# Patient Record
Sex: Female | Born: 1955 | Race: Black or African American | Hispanic: No | Marital: Single | Smoking: Never smoker
Health system: Southern US, Community
[De-identification: ages and names within clinical notes are randomized; demographics above are authoritative.]

## PROBLEM LIST (undated history)

## (undated) DIAGNOSIS — K922 Gastrointestinal hemorrhage, unspecified: Secondary | ICD-10-CM

## (undated) DIAGNOSIS — N189 Chronic kidney disease, unspecified: Secondary | ICD-10-CM

## (undated) DIAGNOSIS — B20 Human immunodeficiency virus [HIV] disease: Secondary | ICD-10-CM

## (undated) DIAGNOSIS — I10 Essential (primary) hypertension: Secondary | ICD-10-CM

---

## 2020-03-06 ENCOUNTER — Encounter (HOSPITAL_COMMUNITY): Payer: Self-pay | Admitting: Internal Medicine

## 2020-03-06 ENCOUNTER — Emergency Department (HOSPITAL_COMMUNITY): Payer: Medicare Other

## 2020-03-06 ENCOUNTER — Inpatient Hospital Stay (HOSPITAL_COMMUNITY)
Admission: EM | Admit: 2020-03-06 | Discharge: 2020-03-08 | DRG: 291 | Disposition: A | Discharge status: Home / Self Care | Payer: Medicare Other | Attending: Internal Medicine | Admitting: Internal Medicine

## 2020-03-06 ENCOUNTER — Other Ambulatory Visit: Payer: Self-pay

## 2020-03-06 DIAGNOSIS — B20 Human immunodeficiency virus [HIV] disease: Secondary | ICD-10-CM | POA: Diagnosis present

## 2020-03-06 DIAGNOSIS — Z882 Allergy status to sulfonamides status: Secondary | ICD-10-CM | POA: Diagnosis not present

## 2020-03-06 DIAGNOSIS — R000 Tachycardia, unspecified: Secondary | ICD-10-CM | POA: Diagnosis not present

## 2020-03-06 DIAGNOSIS — I272 Pulmonary hypertension, unspecified: Secondary | ICD-10-CM | POA: Diagnosis present

## 2020-03-06 DIAGNOSIS — Z20822 Contact with and (suspected) exposure to covid-19: Secondary | ICD-10-CM | POA: Diagnosis present

## 2020-03-06 DIAGNOSIS — I248 Other forms of acute ischemic heart disease: Secondary | ICD-10-CM | POA: Diagnosis present

## 2020-03-06 DIAGNOSIS — F419 Anxiety disorder, unspecified: Secondary | ICD-10-CM

## 2020-03-06 DIAGNOSIS — J9601 Acute respiratory failure with hypoxia: Secondary | ICD-10-CM | POA: Diagnosis present

## 2020-03-06 DIAGNOSIS — E8770 Fluid overload, unspecified: Secondary | ICD-10-CM

## 2020-03-06 DIAGNOSIS — J810 Acute pulmonary edema: Secondary | ICD-10-CM | POA: Diagnosis not present

## 2020-03-06 DIAGNOSIS — G44209 Tension-type headache, unspecified, not intractable: Secondary | ICD-10-CM | POA: Diagnosis present

## 2020-03-06 DIAGNOSIS — I132 Hypertensive heart and chronic kidney disease with heart failure and with stage 5 chronic kidney disease, or end stage renal disease: Principal | ICD-10-CM | POA: Diagnosis present

## 2020-03-06 DIAGNOSIS — I5042 Chronic combined systolic (congestive) and diastolic (congestive) heart failure: Secondary | ICD-10-CM | POA: Diagnosis present

## 2020-03-06 DIAGNOSIS — G8929 Other chronic pain: Secondary | ICD-10-CM | POA: Diagnosis present

## 2020-03-06 DIAGNOSIS — D631 Anemia in chronic kidney disease: Secondary | ICD-10-CM | POA: Diagnosis present

## 2020-03-06 DIAGNOSIS — Z880 Allergy status to penicillin: Secondary | ICD-10-CM | POA: Diagnosis not present

## 2020-03-06 DIAGNOSIS — E1122 Type 2 diabetes mellitus with diabetic chronic kidney disease: Secondary | ICD-10-CM

## 2020-03-06 DIAGNOSIS — I160 Hypertensive urgency: Secondary | ICD-10-CM | POA: Diagnosis not present

## 2020-03-06 DIAGNOSIS — N186 End stage renal disease: Secondary | ICD-10-CM | POA: Diagnosis present

## 2020-03-06 DIAGNOSIS — I161 Hypertensive emergency: Secondary | ICD-10-CM | POA: Diagnosis present

## 2020-03-06 DIAGNOSIS — Z992 Dependence on renal dialysis: Secondary | ICD-10-CM

## 2020-03-06 DIAGNOSIS — E875 Hyperkalemia: Secondary | ICD-10-CM | POA: Diagnosis present

## 2020-03-06 DIAGNOSIS — D72829 Elevated white blood cell count, unspecified: Secondary | ICD-10-CM

## 2020-03-06 DIAGNOSIS — Z9119 Patient's noncompliance with other medical treatment and regimen: Secondary | ICD-10-CM | POA: Diagnosis not present

## 2020-03-06 DIAGNOSIS — Z681 Body mass index (BMI) 19 or less, adult: Secondary | ICD-10-CM

## 2020-03-06 DIAGNOSIS — Z888 Allergy status to other drugs, medicaments and biological substances status: Secondary | ICD-10-CM | POA: Diagnosis not present

## 2020-03-06 DIAGNOSIS — R636 Underweight: Secondary | ICD-10-CM | POA: Diagnosis present

## 2020-03-06 DIAGNOSIS — Z79899 Other long term (current) drug therapy: Secondary | ICD-10-CM | POA: Diagnosis not present

## 2020-03-06 DIAGNOSIS — I120 Hypertensive chronic kidney disease with stage 5 chronic kidney disease or end stage renal disease: Secondary | ICD-10-CM

## 2020-03-06 DIAGNOSIS — D638 Anemia in other chronic diseases classified elsewhere: Secondary | ICD-10-CM

## 2020-03-06 DIAGNOSIS — I10 Essential (primary) hypertension: Secondary | ICD-10-CM

## 2020-03-06 HISTORY — DX: Chronic kidney disease, unspecified: N18.9

## 2020-03-06 HISTORY — DX: Essential (primary) hypertension: I10

## 2020-03-06 HISTORY — DX: Human immunodeficiency virus (HIV) disease: B20

## 2020-03-06 HISTORY — DX: Gastrointestinal hemorrhage, unspecified: K92.2

## 2020-03-06 LAB — I-STAT CHEM 8, ED
BUN: 91 mg/dL — ABNORMAL HIGH (ref 8–23)
Calcium, Ion: 1 mmol/L — ABNORMAL LOW (ref 1.15–1.40)
Chloride: 108 mmol/L (ref 98–111)
Creatinine, Ser: 10.9 mg/dL — ABNORMAL HIGH (ref 0.44–1.00)
Glucose, Bld: 103 mg/dL — ABNORMAL HIGH (ref 70–99)
HCT: 44 % (ref 36.0–46.0)
Hemoglobin: 15 g/dL (ref 12.0–15.0)
Potassium: 6.3 mmol/L (ref 3.5–5.1)
Sodium: 139 mmol/L (ref 135–145)
TCO2: 22 mmol/L (ref 22–32)

## 2020-03-06 LAB — COMPREHENSIVE METABOLIC PANEL
ALT: 24 U/L (ref 0–44)
AST: 61 U/L — ABNORMAL HIGH (ref 15–41)
Albumin: 2.8 g/dL — ABNORMAL LOW (ref 3.5–5.0)
Alkaline Phosphatase: 92 U/L (ref 38–126)
Anion gap: 21 — ABNORMAL HIGH (ref 5–15)
BUN: 64 mg/dL — ABNORMAL HIGH (ref 8–23)
CO2: 17 mmol/L — ABNORMAL LOW (ref 22–32)
Calcium: 9.4 mg/dL (ref 8.9–10.3)
Chloride: 102 mmol/L (ref 98–111)
Creatinine, Ser: 10.14 mg/dL — ABNORMAL HIGH (ref 0.44–1.00)
GFR calc non Af Amer: 4 mL/min — ABNORMAL LOW (ref >60)
Glucose, Bld: 103 mg/dL — ABNORMAL HIGH (ref 70–99)
Potassium: 6.6 mmol/L (ref 3.5–5.1)
Sodium: 140 mmol/L (ref 135–145)
Total Bilirubin: 1.6 mg/dL — ABNORMAL HIGH (ref 0.3–1.2)
Total Protein: 6.9 g/dL (ref 6.5–8.1)

## 2020-03-06 LAB — BASIC METABOLIC PANEL
Anion gap: 18 — ABNORMAL HIGH (ref 5–15)
BUN: 22 mg/dL (ref 8–23)
CO2: 24 mmol/L (ref 22–32)
Calcium: 9.4 mg/dL (ref 8.9–10.3)
Chloride: 97 mmol/L — ABNORMAL LOW (ref 98–111)
Creatinine, Ser: 4.47 mg/dL — ABNORMAL HIGH (ref 0.44–1.00)
GFR, Estimated: 10 mL/min — ABNORMAL LOW (ref >60)
Glucose, Bld: 85 mg/dL (ref 70–99)
Potassium: 4 mmol/L (ref 3.5–5.1)
Sodium: 139 mmol/L (ref 135–145)

## 2020-03-06 LAB — RESPIRATORY PANEL BY RT PCR (FLU A&B, COVID)
Influenza A by PCR: NEGATIVE
Influenza B by PCR: NEGATIVE
SARS Coronavirus 2 by RT PCR: NEGATIVE

## 2020-03-06 LAB — CBC
HCT: 43 % (ref 36.0–46.0)
Hemoglobin: 13.8 g/dL (ref 12.0–15.0)
MCH: 32.7 pg (ref 26.0–34.0)
MCHC: 32.1 g/dL (ref 30.0–36.0)
MCV: 101.9 fL — ABNORMAL HIGH (ref 80.0–100.0)
Platelets: 288 10*3/uL (ref 150–400)
RBC: 4.22 MIL/uL (ref 3.87–5.11)
RDW: 19.2 % — ABNORMAL HIGH (ref 11.5–15.5)
WBC: 12.3 10*3/uL — ABNORMAL HIGH (ref 4.0–10.5)
nRBC: 0 % (ref 0.0–0.2)

## 2020-03-06 LAB — BRAIN NATRIURETIC PEPTIDE: B Natriuretic Peptide: >4500 pg/mL — ABNORMAL HIGH (ref 0.0–100.0)

## 2020-03-06 LAB — HIV ANTIBODY (ROUTINE TESTING W REFLEX): HIV Screen 4th Generation wRfx: REACTIVE — AB

## 2020-03-06 LAB — TROPONIN I (HIGH SENSITIVITY)
Troponin I (High Sensitivity): 25 ng/L — ABNORMAL HIGH (ref <18)
Troponin I (High Sensitivity): 57 ng/L — ABNORMAL HIGH (ref <18)

## 2020-03-06 LAB — PHOSPHORUS: Phosphorus: 4.4 mg/dL (ref 2.5–4.6)

## 2020-03-06 MED ORDER — ONDANSETRON HCL 4 MG PO TABS: 4.0000 mg | ORAL_TABLET | Freq: Four times a day (QID) | ORAL | Status: DC | PRN

## 2020-03-06 MED ORDER — ACETAMINOPHEN 325 MG PO TABS
650.0000 mg | ORAL_TABLET | Freq: Four times a day (QID) | ORAL | Status: DC | PRN
  Administered 2020-03-08: 650 mg via ORAL
  Filled 2020-03-06: qty 2

## 2020-03-06 MED ORDER — CHLORHEXIDINE GLUCONATE CLOTH 2 % EX PADS
6.0000 | MEDICATED_PAD | Freq: Every day | CUTANEOUS | Status: DC
  Administered 2020-03-07: 6 via TOPICAL

## 2020-03-06 MED ORDER — OXYCODONE HCL 5 MG PO TABS
5.0000 mg | ORAL_TABLET | Freq: Four times a day (QID) | ORAL | Status: AC | PRN
  Administered 2020-03-06 – 2020-03-07 (×2): 5 mg via ORAL
  Filled 2020-03-06 (×2): qty 1

## 2020-03-06 MED ORDER — HYDROMORPHONE HCL 1 MG/ML IJ SOLN
0.5000 mg | Freq: Once | INTRAMUSCULAR | Status: AC
  Administered 2020-03-06: 0.5 mg via INTRAVENOUS
  Filled 2020-03-06: qty 1

## 2020-03-06 MED ORDER — HYDROCODONE-ACETAMINOPHEN 5-325 MG PO TABS: 1.0000 | ORAL_TABLET | Freq: Once | ORAL | Status: AC

## 2020-03-06 MED ORDER — CALCIUM CHLORIDE 10 % IV SOLN
1.0000 g | Freq: Once | INTRAVENOUS | Status: DC
  Administered 2020-03-06: 1 g via INTRAVENOUS
  Filled 2020-03-06: qty 10

## 2020-03-06 MED ORDER — HYDRALAZINE HCL 20 MG/ML IJ SOLN
10.0000 mg | Freq: Once | INTRAMUSCULAR | Status: AC
  Administered 2020-03-06: 10 mg via INTRAVENOUS
  Filled 2020-03-06: qty 1

## 2020-03-06 MED ORDER — ONDANSETRON HCL 4 MG/2ML IJ SOLN: 4.0000 mg | Freq: Four times a day (QID) | INTRAMUSCULAR | Status: DC | PRN

## 2020-03-06 MED ORDER — HEPARIN SODIUM (PORCINE) 1000 UNIT/ML IJ SOLN
INTRAMUSCULAR | Status: AC
  Filled 2020-03-06: qty 4

## 2020-03-06 MED ORDER — RAMELTEON 8 MG PO TABS
8.0000 mg | ORAL_TABLET | Freq: Every day | ORAL | Status: DC
  Administered 2020-03-06 – 2020-03-07 (×2): 8 mg via ORAL
  Filled 2020-03-06 (×3): qty 1

## 2020-03-06 MED ORDER — SENNOSIDES-DOCUSATE SODIUM 8.6-50 MG PO TABS
1.0000 | ORAL_TABLET | Freq: Every evening | ORAL | Status: DC | PRN
  Administered 2020-03-07: 1 via ORAL
  Filled 2020-03-06: qty 1

## 2020-03-06 MED ORDER — ACETAMINOPHEN 650 MG RE SUPP: 650.0000 mg | Freq: Four times a day (QID) | RECTAL | Status: DC | PRN

## 2020-03-06 MED ORDER — CALCIUM CHLORIDE 10 % IV SOLN
1.0000 g | Freq: Once | INTRAVENOUS | Status: AC
  Administered 2020-03-06: 1 g via INTRAVENOUS

## 2020-03-06 MED ORDER — MEGESTROL ACETATE 40 MG PO TABS
40.0000 mg | ORAL_TABLET | Freq: Two times a day (BID) | ORAL | Status: DC
  Administered 2020-03-06 – 2020-03-08 (×4): 40 mg via ORAL
  Filled 2020-03-06 (×5): qty 1

## 2020-03-06 MED ORDER — HEPARIN SODIUM (PORCINE) 5000 UNIT/ML IJ SOLN
5000.0000 [IU] | Freq: Three times a day (TID) | INTRAMUSCULAR | Status: DC
  Filled 2020-03-06 (×2): qty 1

## 2020-03-06 MED ORDER — DEXTROSE 50 % IV SOLN
25.0000 g | Freq: Once | INTRAVENOUS | Status: AC
  Filled 2020-03-06: qty 50

## 2020-03-06 MED ORDER — INSULIN ASPART 100 UNIT/ML ~~LOC~~ SOLN
6.0000 [IU] | Freq: Once | SUBCUTANEOUS | Status: AC
  Administered 2020-03-06: 6 [IU] via INTRAVENOUS

## 2020-03-06 MED ORDER — DEXTROSE 50 % IV SOLN
INTRAVENOUS | Status: AC
  Administered 2020-03-06: 25 g via INTRAVENOUS
  Filled 2020-03-06: qty 50

## 2020-03-06 MED ORDER — SODIUM BICARBONATE 8.4 % IV SOLN
50.0000 meq | Freq: Once | INTRAVENOUS | Status: AC
  Administered 2020-03-06: 50 meq via INTRAVENOUS
  Filled 2020-03-06: qty 50

## 2020-03-06 MED ORDER — HYDROCODONE-ACETAMINOPHEN 5-325 MG PO TABS
ORAL_TABLET | ORAL | Status: AC
  Administered 2020-03-06: 1 via ORAL
  Filled 2020-03-06: qty 1

## 2020-03-06 NOTE — Progress Notes (Signed)
   03/06/20 1823  Assess: MEWS Score  Temp 99.8 F (37.7 C)  BP (!) 184/101  Pulse Rate (!) 114  ECG Heart Rate (!) 113  Resp (!) 30  Level of Consciousness Alert  SpO2 100 %  O2 Device Bi-PAP  Patient Activity (if Appropriate) In bed  FiO2 (%) 40 %  Assess: MEWS Score  MEWS Temp 0  MEWS Systolic 0  MEWS Pulse 2  MEWS RR 2  MEWS LOC 0  MEWS Score 4  MEWS Score Color Red  Assess: if the MEWS score is Yellow or Red  Were vital signs taken at a resting state? Yes  Focused Assessment No change from prior assessment  Early Detection of Sepsis Score *See Row Information* Medium  MEWS guidelines implemented *See Row Information* No, previously yellow, continue vital signs every 4 hours  Treat  MEWS Interventions Other (Comment) (paged MD residents)  Pain Scale 0-10  Pain Score 0  Take Vital Signs  Increase Vital Sign Frequency  Red: Q 1hr X 4 then Q 4hr X 4, if remains red, continue Q 4hrs  Escalate  MEWS: Escalate Red: discuss with charge nurse/RN and provider, consider discussing with RRT  Notify: Charge Nurse/RN  Name of Charge Nurse/RN Notified Barbie RN  Date Charge Nurse/RN Notified 03/06/20  Time Charge Nurse/RN Notified 7341  Notify: Provider  Provider Name/Title Resident MD  Date Provider Notified 03/06/20  Time Provider Notified 1820  Notification Type Page  Notification Reason Change in status  Response No new orders  Date of Provider Response 03/06/20  Time of Provider Response 1830  Document  Patient Outcome Other (Comment) (Monitor patient. No new interventions)  Progress note created (see row info) Yes   Patient arrived onto the unit from hemodialysis with a red MEWS score of 4/5 from a yellow MEWS. Patient is currently on the BIPAP and is tachypniec and tachycardic due to missed dialysis. MD paged and made aware. Spoke to MD on the phone and relayed that a SBP of 160s-180s is the current goal; wanting to drop the BP slowly. Agricultural consultant and RR made aware.  Red MEWS protocol implemented. VS parameters have been cycled for set times. Will continue to monitor for changes and report off to the oncoming RN.

## 2020-03-06 NOTE — Consult Note (Signed)
Renal Service Consult Note [facility]  Shirley Dodson 03/06/2020 [place] Requesting Physician:  Dr. Dareen Piano  Reason for Consult:  ESRD pt w/ resp distress HPI: The patient is a 64 y.o. year-old w/ unknown PMH , esrd on HD in [place], missed HD wed, last HD MWF. Presents to ED w/ resp distress. CXR showing pulm edema.  Asked to see for dialysis.   Pt unable to give much hx, on bipap in ED.  States her last HD was Monday, missed Wed, we couldn't communicate well enough to figure out why. Denies any active CP, abd pain or n/v/d.  No fevers, no prod cough.   ROS  denies CP  no joint pain   no HA  no blurry vision  no rash  no diarrhea  no nausea/ vomiting   Past Medical History No past medical history on file. Past Surgical History  Family History No family history on file. Social History  has no history on file for tobacco use, alcohol use, and drug use. Allergies  Allergies  Allergen Reactions   Penicillins Nausea Only, Rash and Other (See Comments)    Reaction occurred > 10 years ago. PCN allergy assessment completed 03/07/19 by Dorathy Daft, PharmD   Sulfa Antibiotics Nausea And Vomiting, Nausea Only and Rash    Severe gastro intolerance, gi distress   Nitrofuran Derivatives Other (See Comments)   Home medications Prior to Admission medications   Not on File     Vitals:   03/06/20 1130 03/06/20 1215 03/06/20 1245 03/06/20 1300  BP: (!) 202/100 (!) 200/110 (!) 199/100 (!) 185/95  Pulse: (!) 138 (!) 138 (!) 145 (!) 137  Resp: (!) 34 (!) 29 (!) 38 (!) 39  Temp:      SpO2: 100% 100% 100% 100%  Weight:    49.9 kg  Height:    5\' 4"  (1.626 m)   Exam Gen slim adult AAF on bipap, RR 30's, not in severe distress On Bipap No rash, cyanosis or gangrene Sclera anicteric, throat not seen  +JVD Chest bilat rales 1/3 up RRR no MRG Abd soft ntnd no mass or ascites +bs GU defer MS no joint effusions or deformity Ext 2+ pitting pretib/ pedal  edema Neuro is alert, Ox 3 , nf  LUA aVG+bruit   Home meds:  - no meds noted here         Na 140 K 6.6  CO2 17  BUN 64  CR 109.14  Alb 2.8  WBC 12k  hB 13  PLT 288     CXR - IMPRESSION: Cardiomegaly and symmetric airspace disease/alveolar edema.  Assessment/ Plan: 1. Pulm edema / resp distress - due to vol overload, missed HD. HD upstairs now, UF 3-4 L as tolerated. Bipap support.  2. Hyperkalemia - low K bath , f/u in am 3. ESRD - on HD MWF.  [place], get details after HD.   4. HIV 5. HTN - get vol down w hd, get home med list    Kelly Splinter  MD 03/06/2020, 1:48 PM  Recent Labs  Lab 03/06/20 0950 03/06/20 1023  WBC 12.3*  --   HGB 13.8 15.0   Recent Labs  Lab 03/06/20 0950 03/06/20 1023  K 6.6* 6.3*  BUN 64* 91*  CREATININE 10.14* 10.90*  CALCIUM 9.4  --
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
## 2020-03-06 NOTE — Progress Notes (Signed)
Changed patient to V-60 from servo and RR increased from 30 to 52. RN notified of change. RN requested pt transport. Assisted with transport to dialysis. RR continued to be 29 MD in Dialysis and RN notified. And RT covering unit notified of issues.
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
## 2020-03-06 NOTE — Progress Notes (Addendum)
Patient evaluated for continued tachypnea. She went to urgent HD today and had 2557ml removed, goal was 3-4L. Remains on BiPAP.  Subjective:  Patient states that she feels significantly better than on admission, but still having shortness of breath. Continues to have leg swelling. Asking to eat dinner. Also requesting her home narcotic medications.  Objective: VS: RR 32-37, SpO2 100%, HR 107 and sinus rhythm, BP 177/104 PE: tachypneic but appears fairly comfortable without accessory muscle use, moderate crackles in lung bases bilaterally, JVD elevated to the angle of the jaw, bilateral lower extremities with 3+ pitting edema to the knees, abdomen nondistended, bandage and stables noted from hernia repair 2 weeks ago Labs: K improved 6.3 > 4.0 and creatinine 10.9 > 4.47 after dialysis  Assessment:  Patient reports feeling improvement in SOB but remains tachypneic and volume overload despite dialysis this afternoon. Lack of accessory muscle use and only moderate crackles seem somewhat inconsistent with her respiratory rate, there could be an anxiety/pain component. BP is roughly at goal.  PDMP reviewed and last filled Oxycodone 5mg , 30 tablets on 9/24, unclear duration (written for 2 days but likely an error).  Plan:  -trial off BiPAP  -NPO -diuretics unhelpful with her ESRD -dialysis per nephro -low threshold to call PCCM for intubation -SBP goal 160s-180s, received hydralazine per nephro, continue to hold home BP meds for now     -restart clonidine if BP rises -oxycodone 5mg  q6hr prn  Addendum: -breathing comfortably off BiPAP. Noted to have normal RR while sleeping and then increased after waking her so suspect some anxiety component     -ordered ramelteon nightly -renal diet and fluid restriction -ordered home megace 40mg  BID

## 2020-03-06 NOTE — ED Provider Notes (Signed)
[facility] EMERGENCY DEPARTMENT Provider Note   CSN: [id number] Arrival date & time: 03/06/20  0093     History Chief Complaint  Patient presents with  . Shortness of Breath  . Respiratory Distress    Shirley Dodson is a 64 y.o. female.  Patient with hx esrd/hd M/W/F was driving to dialysis when became very sob. Per report, pt did have normal dialysis 2 days ago. Symptoms acute onset, mod-severe, constant, persistent. EMS found o2 sats in 80s and placed on NRB. Patient denies chest pain. +bilatereal leg swelling. EMS noted BP very high. Patient on bipap, extremely sob, unable to provide additional history - level 5 caveat.   The history is provided by the patient and the EMS personnel. The history is limited by the condition of the patient.  Shortness of Breath      Past Medical History:  Diagnosis Date  . Acute upper GI bleed   . Chronic kidney disease   . HIV (human immunodeficiency virus infection) ([place])   . Hypertension     Patient Active Problem List   Diagnosis Date Noted  . HIV disease ([place]) 03/07/2020  . HTN (hypertension) 03/07/2020  . Anemia of chronic disease 03/07/2020  . Chronic combined systolic and diastolic congestive heart failure ([place]) 03/07/2020  . Pulmonary HTN ([place]) 03/07/2020  . Acute respiratory failure with hypoxia ([place]) 03/06/2020    History reviewed. No pertinent surgical history.   OB History   No obstetric history on file.     No family history on file.  Social History   Tobacco Use  . Smoking status: Never Smoker  . Smokeless tobacco: Never Used  Substance Use Topics  . Alcohol use: Not on file  . Drug use: Not on file    Home Medications Prior to Admission medications   Medication Sig Start Date End Date Taking? Authorizing Provider  amLODipine (NORVASC) 10 MG tablet Take 10 mg by mouth daily. 07/02/19  Yes [provider]  cloNIDine (CATAPRES) 0.2 MG tablet Take 0.2 mg by mouth 2 (two) times  daily. 01/29/20  Yes [provider]  hydrALAZINE (APRESOLINE) 100 MG tablet Take 100 mg by mouth 2 (two) times daily. 02/14/19  Yes [provider]  megestrol (MEGACE) 40 MG tablet Take 40 mg by mouth 2 (two) times daily.   Yes [provider]  metoprolol succinate (TOPROL-XL) 100 MG 24 hr tablet Take 100 mg by mouth daily. 02/15/19  Yes [provider]    Allergies    Penicillins, Sulfa antibiotics, and Nitrofuran derivatives  Review of Systems   Review of Systems  Unable to perform ROS: Severe respiratory distress  Respiratory: Positive for shortness of breath.   level 5 caveat, severe dyspnea    Physical Exam Updated Vital Signs BP (!) 147/78 (BP Location: Right Arm)   Pulse 83   Temp 98.2 F (36.8 C) (Oral)   Resp 18   Ht 1.689 m (5' 6.5")   Wt 49.4 kg   SpO2 100%   BMI 17.31 kg/m   Physical Exam Vitals and nursing note reviewed.  Constitutional:      General: She is in acute distress.     Appearance: Normal appearance. She is well-developed.  HENT:     Head: Atraumatic.     Nose: Nose normal.     Mouth/Throat:     Mouth: Mucous membranes are moist.  Eyes:     General: No scleral icterus.    Conjunctiva/sclera: Conjunctivae normal.  Neck:  Trachea: No tracheal deviation.  Cardiovascular:     Rate and Rhythm: Regular rhythm. Tachycardia present.     Pulses: Normal pulses.     Heart sounds: Normal heart sounds. No murmur heard.  No friction rub. No gallop.   Pulmonary:     Effort: Respiratory distress present.     Breath sounds: Rales present.  Abdominal:     General: Bowel sounds are normal. There is no distension.     Palpations: Abdomen is soft.     Tenderness: There is no abdominal tenderness. There is no guarding.  Genitourinary:    Comments: No cva tenderness.  Musculoskeletal:        General: No tenderness.     Cervical back: Normal range of motion and neck supple. No rigidity. No muscular tenderness.     Right  lower leg: Edema present.     Left lower leg: Edema present.  Skin:    General: Skin is warm and dry.     Findings: No rash.  Neurological:     Mental Status: She is alert.     Comments: Alert, responds to questions appropriately. Moves bil extremities purposefully. Follows commands.   Psychiatric:        Mood and Affect: Mood normal.     ED Results / Procedures / Treatments   Labs (all labs ordered are listed, but only abnormal results are displayed) Results for orders placed or performed during the hospital encounter of 03/06/20  Respiratory Panel by RT PCR (Flu A&B, Covid) - Nasopharyngeal Swab   Specimen: Nasopharyngeal Swab  Result Value Ref Range   SARS Coronavirus 2 by RT PCR NEGATIVE NEGATIVE   Influenza A by PCR NEGATIVE NEGATIVE   Influenza B by PCR NEGATIVE NEGATIVE  CBC  Result Value Ref Range   WBC 12.3 (H) 4.0 - 10.5 K/uL   RBC 4.22 3.87 - 5.11 MIL/uL   Hemoglobin 13.8 12.0 - 15.0 g/dL   HCT 43.0 36 - 46 %   MCV 101.9 (H) 80.0 - 100.0 fL   MCH 32.7 26.0 - 34.0 pg   MCHC 32.1 30.0 - 36.0 g/dL   RDW 19.2 (H) 11.5 - 15.5 %   Platelets 288 150 - 400 K/uL   nRBC 0.0 0.0 - 0.2 %  Comprehensive metabolic panel  Result Value Ref Range   Sodium 140 135 - 145 mmol/L   Potassium 6.6 (HH) 3.5 - 5.1 mmol/L   Chloride 102 98 - 111 mmol/L   CO2 17 (L) 22 - 32 mmol/L   Glucose, Bld 103 (H) 70 - 99 mg/dL   BUN 64 (H) 8 - 23 mg/dL   Creatinine, Ser 10.14 (H) 0.44 - 1.00 mg/dL   Calcium 9.4 8.9 - 10.3 mg/dL   Total Protein 6.9 6.5 - 8.1 g/dL   Albumin 2.8 (L) 3.5 - 5.0 g/dL   AST 61 (H) 15 - 41 U/L   ALT 24 0 - 44 U/L   Alkaline Phosphatase 92 38 - 126 U/L   Total Bilirubin 1.6 (H) 0.3 - 1.2 mg/dL   GFR calc non Af Amer 4 (L) >60 mL/min   Anion gap 21 (H) 5 - 15  Brain natriuretic peptide  Result Value Ref Range   B Natriuretic Peptide >4,500.0 (H) 0.0 - 100.0 pg/mL  HIV Antibody (routine testing w rflx)  Result Value Ref Range   HIV Screen 4th Generation wRfx  Reactive (A) Non Reactive  Basic metabolic panel  Result Value Ref Range   Sodium  139 135 - 145 mmol/L   Potassium 4.0 3.5 - 5.1 mmol/L   Chloride 97 (L) 98 - 111 mmol/L   CO2 24 22 - 32 mmol/L   Glucose, Bld 85 70 - 99 mg/dL   BUN 22 8 - 23 mg/dL   Creatinine, Ser 4.47 (H) 0.44 - 1.00 mg/dL   Calcium 9.4 8.9 - 10.3 mg/dL   GFR, Estimated 10 (L) >60 mL/min   Anion gap 18 (H) 5 - 15  Phosphorus  Result Value Ref Range   Phosphorus 4.4 2.5 - 4.6 mg/dL  CBC  Result Value Ref Range   WBC 7.5 4.0 - 10.5 K/uL   RBC 2.41 (L) 3.87 - 5.11 MIL/uL   Hemoglobin 7.6 (L) 12.0 - 15.0 g/dL   HCT 24.4 (L) 36 - 46 %   MCV 101.2 (H) 80.0 - 100.0 fL   MCH 31.5 26.0 - 34.0 pg   MCHC 31.1 30.0 - 36.0 g/dL   RDW 18.0 (H) 11.5 - 15.5 %   Platelets 252 150 - 400 K/uL   nRBC 0.0 0.0 - 0.2 %  Comprehensive metabolic panel  Result Value Ref Range   Sodium 139 135 - 145 mmol/L   Potassium 5.1 3.5 - 5.1 mmol/L   Chloride 98 98 - 111 mmol/L   CO2 28 22 - 32 mmol/L   Glucose, Bld 103 (H) 70 - 99 mg/dL   BUN 33 (H) 8 - 23 mg/dL   Creatinine, Ser 5.92 (H) 0.44 - 1.00 mg/dL   Calcium 9.2 8.9 - 10.3 mg/dL   Total Protein 6.3 (L) 6.5 - 8.1 g/dL   Albumin 2.6 (L) 3.5 - 5.0 g/dL   AST 50 (H) 15 - 41 U/L   ALT 29 0 - 44 U/L   Alkaline Phosphatase 105 38 - 126 U/L   Total Bilirubin 1.0 0.3 - 1.2 mg/dL   GFR, Estimated 7 (L) >60 mL/min   Anion gap 13 5 - 15  Hepatitis B surface antigen  Result Value Ref Range   Hepatitis B Surface Ag NON REACTIVE NON REACTIVE  Hepatitis B surface antibody,qualitative  Result Value Ref Range   Hep B S Ab Reactive (A) NON REACTIVE  Hepatitis B core antibody, total  Result Value Ref Range   Hep B Core Total Ab Reactive (A) NON REACTIVE  HIV-1/2 AB - differentiation  Result Value Ref Range   HIV 1 Ab Positive (A) Negative   HIV 2 Ab Indeterminate Negative   Note SEE COMMENT (A)   Hemoglobin and hematocrit, blood  Result Value Ref Range   Hemoglobin 7.7 (L) 12.0 -  15.0 g/dL   HCT 25.2 (L) 36 - 46 %  Renal function panel  Result Value Ref Range   Sodium 138 135 - 145 mmol/L   Potassium 3.9 3.5 - 5.1 mmol/L   Chloride 97 (L) 98 - 111 mmol/L   CO2 28 22 - 32 mmol/L   Glucose, Bld 109 (H) 70 - 99 mg/dL   BUN 22 8 - 23 mg/dL   Creatinine, Ser 3.93 (H) 0.44 - 1.00 mg/dL   Calcium 8.9 8.9 - 10.3 mg/dL   Phosphorus 5.6 (H) 2.5 - 4.6 mg/dL   Albumin 2.4 (L) 3.5 - 5.0 g/dL   GFR, Estimated 12 (L) >60 mL/min   Anion gap 13 5 - 15  CBC  Result Value Ref Range   WBC 6.3 4.0 - 10.5 K/uL   RBC 2.36 (L) 3.87 - 5.11 MIL/uL   Hemoglobin 7.4 (  L) 12.0 - 15.0 g/dL   HCT 23.6 (L) 36 - 46 %   MCV 100.0 80.0 - 100.0 fL   MCH 31.4 26.0 - 34.0 pg   MCHC 31.4 30.0 - 36.0 g/dL   RDW 17.5 (H) 11.5 - 15.5 %   Platelets 251 150 - 400 K/uL   nRBC 0.0 0.0 - 0.2 %  I-stat chem 8, ED (not at [facility] or [facility])  Result Value Ref Range   Sodium 139 135 - 145 mmol/L   Potassium 6.3 (HH) 3.5 - 5.1 mmol/L   Chloride 108 98 - 111 mmol/L   BUN 91 (H) 8 - 23 mg/dL   Creatinine, Ser 10.90 (H) 0.44 - 1.00 mg/dL   Glucose, Bld 103 (H) 70 - 99 mg/dL   Calcium, Ion 1.00 (L) 1.15 - 1.40 mmol/L   TCO2 22 22 - 32 mmol/L   Hemoglobin 15.0 12.0 - 15.0 g/dL   HCT 44.0 36 - 46 %   Comment NOTIFIED PHYSICIAN   I-STAT, chem 8  Result Value Ref Range   Sodium 139 135 - 145 mmol/L   Potassium 6.3 (HH) 3.5 - 5.1 mmol/L   Chloride 108 98 - 111 mmol/L   BUN 91 (H) 8 - 23 mg/dL   Creatinine, Ser 10.90 (H) 0.44 - 1.00 mg/dL   Glucose, Bld 103 (H) 70 - 99 mg/dL   Calcium, Ion 1.00 (L) 1.15 - 1.40 mmol/L   TCO2 22 22 - 32 mmol/L   Hemoglobin 15.0 12.0 - 15.0 g/dL   HCT 44.0 36 - 46 %   Comment NOTIFIED PHYSICIAN   Troponin I (High Sensitivity)  Result Value Ref Range   Troponin I (High Sensitivity) 25 (H) <18 ng/L  Troponin I (High Sensitivity)  Result Value Ref Range   Troponin I (High Sensitivity) 57 (H) <18 ng/L   DG Chest Port 1 View  Result Date: 03/06/2020 CLINICAL DATA:   Shortness of breath EXAM: PORTABLE CHEST 1 VIEW COMPARISON:  None. FINDINGS: Symmetric hazy airspace opacity. Cardiomegaly and small pleural effusions. No pneumothorax. Vascular stent in the left arm, likely related to dialysis access. IMPRESSION: Cardiomegaly and symmetric airspace disease/alveolar edema. Electronically Signed   By: Monte Fantasia M.D.   On: 03/06/2020 10:45    EKG EKG Interpretation  Date/Time:  Friday March 06 2020 09:56:50 EDT Ventricular Rate:  147 PR Interval:    QRS Duration: 86 QT Interval:  286 QTC Calculation: 448 R Axis:   -22 Text Interpretation: Sinus tachycardia Probable left ventricular hypertrophy Non-specific ST-t changes Confirmed by Lajean Saver [phone]) on 03/06/2020 9:58:41 AM Also confirmed by Lajean Saver [phone]), editor Victory Dakin [phone])  on 03/07/2020 9:16:28 AM   Radiology No results found.  Procedures Procedures (including critical care time)  Medications Ordered in ED Medications  heparin sodium (porcine) 1000 UNIT/ML injection (has no administration in time range)  sodium bicarbonate injection 50 mEq (50 mEq Intravenous Given 03/06/20 1101)  dextrose 50 % solution 25 g (25 g Intravenous Given 03/06/20 1105)  insulin aspart (novoLOG) injection 6 Units (6 Units Intravenous Given 03/06/20 1105)  hydrALAZINE (APRESOLINE) injection 10 mg (10 mg Intravenous Given 03/06/20 1114)  calcium chloride injection 1 g (1 g Intravenous Given 03/06/20 1102)  HYDROmorphone (DILAUDID) injection 0.5 mg (0.5 mg Intravenous Given 03/06/20 1310)  HYDROcodone-acetaminophen (NORCO/VICODIN) 5-325 MG per tablet 1 tablet (1 tablet Oral Given 03/06/20 1649)  oxyCODONE (Oxy IR/ROXICODONE) immediate release tablet 5 mg (5 mg Oral Given 03/07/20 0301)  oxyCODONE (Oxy IR/ROXICODONE) 5  MG immediate release tablet (5 mg  Given 03/07/20 1616)  SUMAtriptan (IMITREX) tablet 25 mg (25 mg Oral Given 03/08/20 0737)    ED Course  I have reviewed the triage vital signs and the  nursing notes.  Pertinent labs & imaging results that were available during my care of the patient were reviewed by me and considered in my medical decision making (see chart for details).    MDM Rules/Calculators/A&P                         Iv ns. Stat labs. Stat pcxr. Continuous pulse ox and cardiac monitoring. Ecg.   MDM Number of Diagnoses or Management Options   Amount and/or Complexity of Data Reviewed Clinical lab tests: ordered and reviewed Tests in the radiology section of CPT: ordered and reviewed Tests in the medicine section of CPT: ordered and reviewed Discussion of test results with the performing providers: yes Decide to obtain previous medical records or to obtain history from someone other than the patient: yes Obtain history from someone other than the patient: yes Review and summarize past medical records: yes Discuss the patient with other providers: yes Independent visualization of images, tracings, or specimens: yes  Risk of Complications, Morbidity, and/or Mortality Presenting problems: high Diagnostic procedures: high Management options: high   Reviewed nursing notes and prior charts for additional history.   Pts BP is extremely high, ?flash pulm edema - ntg sl, bipap. [facility] consulted re emergent dialysis.   Patient refuses ntg - discussed reasons for ntg, pulm edema/uncontrolled htn, etc, and risk of resp failure/death - pt refuses ntg.    Labs reviewed/interpreted by me - k high 6.3. cacl iv, d50 iv, insulin iv, hc03 iv, emergent dialysis.   CXR reviewed/interpreted by me - pulm edema.   Discussed pt with Dr Jonnie Finner, renal, including bp, k, need for hd, refusal ntg - he indicates give hydralazine, continue bipap, he will arrange emergent dialysis.   Hr improved 122 currently, breathing a bit easier on bipap, is mentating normally, bp mildly improved.   Medicine consulted for admission - they will see in ED/admit.  Dr Jonnie Finner  recontacted as pt needs emergent HD - he will expedite, states they are coming now for pt.   Recheck pt, mentating normally. Emergent dialysis shortly.  CRITICAL CARE RE: acute respiratory failure, pulmonary edema/uncontrolled htn, esrd/hd, hyperkalemia. Performed by: Mirna Mires Total critical care time: 115 minutes Critical care time was exclusive of separately billable procedures and treating other patients. Critical care was necessary to treat or prevent imminent or life-threatening deterioration. Critical care was time spent personally by me on the following activities: development of treatment plan with patient and/or surrogate as well as nursing, discussions with consultants, evaluation of patient's response to treatment, examination of patient, obtaining history from patient or surrogate, ordering and performing treatments and interventions, ordering and review of laboratory studies, ordering and review of radiographic studies, pulse oximetry and re-evaluation of patient's condition.   Final Clinical Impression(s) / ED Diagnoses Final diagnoses:  ESRD needing dialysis ([place])  Acute respiratory failure with hypoxia ([place])  Hyperkalemia  Uncontrolled hypertension    Rx / DC Orders ED Discharge Orders         Ordered    Increase activity slowly        03/08/20 1308    Diet - low sodium heart healthy        03/08/20 1308  Lajean Saver, MD 03/10/20 1556

## 2020-03-06 NOTE — ED Notes (Signed)
Report given to dialysis. Pt transported with RT and tech.

## 2020-03-06 NOTE — Hospital Course (Addendum)
Admitted 03/06/2020  Allergies: Nitrofuran derivatives Pertinent Hx: ESRD, HD MWF, History of upper GI bleed, HIV, HTN, anemia.  64 y.o. female p/w shortness of breath  *Acute hypoxic respiratory failure: Flash pulm edema likely 2/2 of ESRD and hypertensive urgency. Brought by EMS for SOB and hypoxia O2 80% while driving. Hypoxic at 80s on EMS arrival. Placed on NRB. Resp distress, LEE, pulm edema in ED. Placed on BiPAP in ED. COVID-19 test pending. Nephro consulted and plan to do emergent HD. On BiPAP.  *ESRD on HD MWF: Hyperkalemic of 6.3 and sever LEE and pulm edema w resp distress as above. Insulin, D50, Ca, and Emergent HD per nephro.   *Hypertensive urgency and flash pulm edema: BP 220/120. Refused nitro dtt in ED. Received Hydralazine per nephro and will do emergent HD. Home meds including clonidine but denies missing a dose. Holding home meds today. Nephro is on board. BP goal today not less than 180<100.  Consults: Nephro  Meds: VTE ppx: subcutaneous Hep  VF: none Diet: npo   Received D50 on pulling ED, hydralazine 10 mg IV once, 6 units of NovoLog, 1 ampoule sodium bicarb.  Placed on bicarb  * Matt *  []  Sick patient. Plan for emergent HD. f/u nephro rec  []  f/u 8pm BMP []  F/u trop []  f/u covid test result []  Do not aggressively drop BP. Talk to nephrology if needed.   O/N events:    Home meds: Calcium, metoprolol, Flonase, vitamin D, Abilify, melatonin, megestrol, amlodipine, hydralazine, clonidine, losartan,   (last HD 2 days before admission but lives in [place].  History of noncompliance per chart review.)   *Leukocytosis 12.3. *Hypertensive urgency with BP 215/110 that improved  *Tachycardia of 150 on arrival

## 2020-03-07 DIAGNOSIS — I5042 Chronic combined systolic (congestive) and diastolic (congestive) heart failure: Secondary | ICD-10-CM

## 2020-03-07 DIAGNOSIS — D638 Anemia in other chronic diseases classified elsewhere: Secondary | ICD-10-CM

## 2020-03-07 DIAGNOSIS — I272 Pulmonary hypertension, unspecified: Secondary | ICD-10-CM | POA: Insufficient documentation

## 2020-03-07 DIAGNOSIS — B20 Human immunodeficiency virus [HIV] disease: Secondary | ICD-10-CM

## 2020-03-07 DIAGNOSIS — I10 Essential (primary) hypertension: Secondary | ICD-10-CM

## 2020-03-07 LAB — COMPREHENSIVE METABOLIC PANEL
ALT: 29 U/L (ref 0–44)
AST: 50 U/L — ABNORMAL HIGH (ref 15–41)
Albumin: 2.6 g/dL — ABNORMAL LOW (ref 3.5–5.0)
Alkaline Phosphatase: 105 U/L (ref 38–126)
Anion gap: 13 (ref 5–15)
BUN: 33 mg/dL — ABNORMAL HIGH (ref 8–23)
CO2: 28 mmol/L (ref 22–32)
Calcium: 9.2 mg/dL (ref 8.9–10.3)
Chloride: 98 mmol/L (ref 98–111)
Creatinine, Ser: 5.92 mg/dL — ABNORMAL HIGH (ref 0.44–1.00)
GFR, Estimated: 7 mL/min — ABNORMAL LOW (ref >60)
Glucose, Bld: 103 mg/dL — ABNORMAL HIGH (ref 70–99)
Potassium: 5.1 mmol/L (ref 3.5–5.1)
Sodium: 139 mmol/L (ref 135–145)
Total Bilirubin: 1 mg/dL (ref 0.3–1.2)
Total Protein: 6.3 g/dL — ABNORMAL LOW (ref 6.5–8.1)

## 2020-03-07 LAB — CBC
HCT: 24.4 % — ABNORMAL LOW (ref 36.0–46.0)
Hemoglobin: 7.6 g/dL — ABNORMAL LOW (ref 12.0–15.0)
MCH: 31.5 pg (ref 26.0–34.0)
MCHC: 31.1 g/dL (ref 30.0–36.0)
MCV: 101.2 fL — ABNORMAL HIGH (ref 80.0–100.0)
Platelets: 252 10*3/uL (ref 150–400)
RBC: 2.41 MIL/uL — ABNORMAL LOW (ref 3.87–5.11)
RDW: 18 % — ABNORMAL HIGH (ref 11.5–15.5)
WBC: 7.5 10*3/uL (ref 4.0–10.5)
nRBC: 0 % (ref 0.0–0.2)

## 2020-03-07 LAB — HEMOGLOBIN AND HEMATOCRIT, BLOOD
HCT: 25.2 % — ABNORMAL LOW (ref 36.0–46.0)
Hemoglobin: 7.7 g/dL — ABNORMAL LOW (ref 12.0–15.0)

## 2020-03-07 LAB — HEPATITIS B CORE ANTIBODY, TOTAL: Hep B Core Total Ab: REACTIVE — AB

## 2020-03-07 LAB — HEPATITIS B SURFACE ANTIBODY,QUALITATIVE: Hep B S Ab: REACTIVE — AB

## 2020-03-07 LAB — HEPATITIS B SURFACE ANTIGEN: Hepatitis B Surface Ag: NONREACTIVE

## 2020-03-07 MED ORDER — HYDRALAZINE HCL 50 MG PO TABS
100.0000 mg | ORAL_TABLET | Freq: Two times a day (BID) | ORAL | Status: DC
  Administered 2020-03-07 – 2020-03-08 (×2): 100 mg via ORAL
  Filled 2020-03-07 (×2): qty 2

## 2020-03-07 MED ORDER — METOPROLOL SUCCINATE ER 100 MG PO TB24
100.0000 mg | ORAL_TABLET | Freq: Every day | ORAL | Status: DC
  Administered 2020-03-07 – 2020-03-08 (×2): 100 mg via ORAL
  Filled 2020-03-07 (×2): qty 1

## 2020-03-07 MED ORDER — ORAL CARE MOUTH RINSE
15.0000 mL | Freq: Two times a day (BID) | OROMUCOSAL | Status: DC
  Administered 2020-03-07 – 2020-03-08 (×3): 15 mL via OROMUCOSAL

## 2020-03-07 MED ORDER — BOOST / RESOURCE BREEZE PO LIQD CUSTOM
1.0000 | Freq: Two times a day (BID) | ORAL | Status: DC
  Administered 2020-03-07: 1 via ORAL

## 2020-03-07 MED ORDER — NEPRO/CARBSTEADY PO LIQD: 237.0000 mL | Freq: Two times a day (BID) | ORAL | Status: DC

## 2020-03-07 MED ORDER — PROSOURCE PLUS PO LIQD
30.0000 mL | Freq: Three times a day (TID) | ORAL | Status: DC
  Administered 2020-03-07: 30 mL via ORAL
  Filled 2020-03-07 (×3): qty 30

## 2020-03-07 MED ORDER — POLYETHYLENE GLYCOL 3350 17 G PO PACK
17.0000 g | PACK | Freq: Every day | ORAL | Status: DC
  Filled 2020-03-07 (×2): qty 1

## 2020-03-07 MED ORDER — RENA-VITE PO TABS
1.0000 | ORAL_TABLET | Freq: Every day | ORAL | Status: DC
  Filled 2020-03-07: qty 1

## 2020-03-07 MED ORDER — CLONIDINE HCL 0.3 MG PO TABS: 0.3000 mg | ORAL_TABLET | Freq: Two times a day (BID) | ORAL | Status: DC

## 2020-03-07 MED ORDER — CLONIDINE HCL 0.2 MG PO TABS
0.2000 mg | ORAL_TABLET | Freq: Two times a day (BID) | ORAL | Status: DC
  Administered 2020-03-07 – 2020-03-08 (×2): 0.2 mg via ORAL
  Filled 2020-03-07 (×2): qty 1

## 2020-03-07 MED ORDER — OXYCODONE HCL 5 MG PO TABS
ORAL_TABLET | ORAL | Status: AC
  Administered 2020-03-07: 5 mg
  Filled 2020-03-07: qty 1

## 2020-03-07 MED ORDER — HEPARIN SODIUM (PORCINE) 1000 UNIT/ML DIALYSIS
2000.0000 [IU] | INTRAMUSCULAR | Status: DC | PRN
  Filled 2020-03-07: qty 2

## 2020-03-07 NOTE — Progress Notes (Addendum)
Initial Nutrition Assessment  DOCUMENTATION CODES:   Not applicable (suspect severe PCM)  INTERVENTION:   Liberalize diet as pt is likely malnourished    Boost Breeze po BID, each supplement provides 250 kcal and 9 grams of protein  ProSource Plus 30 ml TID, each supplement provides 100 kcals and 15 grams protein.   Renal MVI daily   NUTRITION DIAGNOSIS:   Increased nutrient needs related to acute on chronic illness as evidenced by estimated needs.  GOAL:   Patient will meet greater than or equal to 90% of their needs  MONITOR:   PO intake, Supplement acceptance, Weight trends, Labs, I & O's  REASON FOR ASSESSMENT:   Malnutrition Screening Tool    ASSESSMENT:   Patient with PMH significant for ESRD on HD, HIV, and HTN. Presents this admission with respiratory failure 2/2 to flash pulmonary edema from hypertensive urgency.   Of note, pt missed her last HD treatment.   Denies loss in appetite PTA. States she consumes three meal daily both on HD days and non HD days. Meals consist of B-cheerios with milk L- hamburger with tomato D- spaghetti or whatever her daughter makes. She likes clear supplements that are provided at HD center (unsure of name) and does not tolerate milky supplements such as Ensure or Nepro. Appetite is okay this admission. Pt tolerated eggs and grits for breakfast. Discussed the importance of protein intake for preservation of lean body mass. Pt willing to try clear supplements only.   Pt states her UBW stayed around 160 lb but is aware she has lost dry weight. She is unsure of EDW at outpatient HD center as we are awaiting information from [place]. Per records pt weighed 119 lb on 4/19 at [facility] and 87 on 8/9 at [facility]. Suspect significant dry weight loss on severe malnutrition but will need to obtain NFPE to diagnose.   Medications: 40 mg megace BID, miralax Labs: CBG 85-103  Diet Order:   Diet Order            Diet renal with fluid restriction  Fluid restriction: 1200 mL Fluid; Room service appropriate? Yes; Fluid consistency: Thin  Diet effective now                 EDUCATION NEEDS:   Education needs have been addressed  Skin:  Skin Assessment: Reviewed RN Assessment  Last BM:  PTA  Height:   Ht Readings from Last 1 Encounters:  03/06/20 5' 6.5" (1.689 m)    Weight:   Wt Readings from Last 1 Encounters:  03/07/20 54.2 kg    BMI:  Body mass index is 18.98 kg/m.  Estimated Nutritional Needs:   Kcal:  1700-1900 kcal  Protein:  90-105 grams  Fluid:  1000 ml + UOP  Mariana Single RD, LDN Clinical Nutrition Pager listed in [place]

## 2020-03-07 NOTE — Progress Notes (Signed)
Pt refuses all Heparin SubQ shots.

## 2020-03-07 NOTE — Progress Notes (Signed)
Round on patient secondary to missed HD treatment. 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 Patient reports that her HD center [facility] on [address] in [place] had previously told her that they would help her facilitate her treatments in times that she needed to vote, because these must be done in person. The patient reports that she informed her center on Monday that she would need to go Wed. She requested to com early on Wed or to come on Thursday to stay on schedule but they declined. She was not feeling well Wed so she stayed overnight in [place] afraid to drive until Friday to make it to her HD treatment but she could not make it and had to pull over and be picked up by EMS. Will coordinate with our Renal Navigator to see if we can come up with a possible solution in the future. Patient also provided with handouts on high potassium foods to avoid secondary to her hyperkalemia. Patient with orders for second HD today with notable 2+ edma to bilateral LE's. Will continue to follow as appropriate. Appreciate Dr. Jonnie Finner referral to coordinator.  Dorthey Sawyer, RN  Dialysis Nurse Coordinator

## 2020-03-07 NOTE — Progress Notes (Signed)
   03/06/20 1900  Assess: MEWS Score  BP (!) 184/107  Pulse Rate (!) 110  ECG Heart Rate (!) 111  Resp (!) 35  SpO2 100 %  Assess: MEWS Score  MEWS Temp 0  MEWS Systolic 0  MEWS Pulse 2  MEWS RR 2  MEWS LOC 0  MEWS Score 4  MEWS Score Color Red  Assess: if the MEWS score is Yellow or Red  Were vital signs taken at a resting state? Yes  Focused Assessment No change from prior assessment  Early Detection of Sepsis Score *See Row Information* Low  MEWS guidelines implemented *See Row Information* Yes  Treat  MEWS Interventions Administered scheduled meds/treatments;Administered prn meds/treatments;Escalated (See documentation below);Consulted Respiratory Therapy  Pain Scale 0-10  Pain Score 10  Take Vital Signs  Increase Vital Sign Frequency  Red: Q 1hr X 4 then Q 4hr X 4, if remains red, continue Q 4hrs  Escalate  MEWS: Escalate Red: discuss with charge nurse/RN and provider, consider discussing with RRT  Notify: Charge Nurse/RN  Name of Charge Nurse/RN Notified Danae Chen  Date Charge Nurse/RN Notified 03/07/20  Time Charge Nurse/RN Notified 1915  Notify: Provider  Provider Name/Title Dr. Bridgett Larsson  Date Provider Notified 03/06/20  Time Provider Notified [phone]  Notification Type Face-to-face  Notification Reason Change in status  Response See new orders  Date of Provider Response 03/06/20  Time of Provider Response 1915  Document  Patient Outcome Not stable and remains on department  Progress note created (see row info) Yes

## 2020-03-07 NOTE — Progress Notes (Signed)
Subjective:  Shirley Dodson is a 64 y.o. 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   This morning on exam she says she feels much better than yesterday. She says her legs are swollen after HD usually, but today her legs are swollen more than usual. She could not recall doses of home BP meds. HTN is a chronic problem for her and she says her BP fluctuates between normal and systolic of 314 at home. She receives most of her care through [facility] and is currently being referred to establish with a PCP through them. She believes her HIV is controlled and endorses compliance on her HIV medications. Denies any shortness of breath at rest this morning on 4 L supplemental oxygen. Not on supplemental oxygen at home. All questions and concerns addressed.   Objective:  Vital signs in last 24 hours: Vitals:   03/07/20 0500 03/07/20 0757 03/07/20 0800 03/07/20 0900  BP: (!) 174/100 (!) 172/96 (!) 172/100 (!) 169/94  Pulse:  (!) 108    Resp: (!) 29 (!) 22 (!) 37 (!) 29  Temp:  98.3 F (36.8 C)    TempSrc:  Oral    SpO2:  99%    Weight:      Height:        Gen: slender AAF in NAD wearing nasal canula Pulm: Bilateral rales to mid lung field, no increase work of breathing Card: Tachycardic, regular rhythm, no murmurs rubs or gallops Ext: 2+ pitting edema up to shins, no deformities Neuro: A&Ox4    CBC Latest Ref Rng & Units 03/07/2020 03/06/2020 03/06/2020  WBC 4.0 - 10.5 K/uL 7.5 - 12.3(H)  Hemoglobin 12.0 - 15.0 g/dL 7.6(L) 15.0 13.8  Hematocrit 36 - 46 % 24.4(L) 44.0 43.0  Platelets 150 - 400 K/uL 252 - 288    BMP Latest Ref Rng & Units 03/07/2020 03/06/2020  03/06/2020  Glucose 70 - 99 mg/dL 103(H) 85 103(H)  BUN 8 - 23 mg/dL 33(H) 22 91(H)  Creatinine 0.44 - 1.00 mg/dL 5.92(H) 4.47(H) 10.90(H)  Sodium 135 - 145 mmol/L 139 139 139  Potassium 3.5 - 5.1 mmol/L 5.1 4.0 6.3(HH)  Chloride 98 - 111 mmol/L 98 97(L) 108  CO2 22 - 32 mmol/L 28 24 -  Calcium 8.9 - 10.3 mg/dL 9.2 9.4 -     Assessment/Plan:  Active Problems:   Acute respiratory failure with hypoxia (HCC)   HIV disease (HCC)   HTN (hypertension)   Anemia of chronic disease   Chronic combined systolic and diastolic congestive heart failure (HCC)  Shirley Dodson is a 64 y.o. with PMH of ESRD on HD, HIV, HTN, HLD , chronic systolic and diastolic heart failure with reported EF of 45%, LVH, pulmonary HTN, sigmoid diverticulosis, and anemia of chronic disease admitted for acute hypoxic respiratory failure secondary to flash pulmonary edema from hypertensive urgency on hospital day 1. \  #Acute hypoxic respiratory failure secondary to flash pulmonary edema from hypertensive emergency Patient looks comfortable this morning and satting well on 4 L supplemental oxygen.  Volume overloaded on exam, 2.5 L removed with HD yesterday.  Continues to be hypertensive this morning goal will be to slowly add home  medications.  Discussed with pharmacy who has plans to do med rec for patient, but short staffed.  Patient hospitalized the [facility] system and care everywhere shows a recent medication list.  Patient was on multiple medications. -Meds per care everywhere losartan 50 mg daily, amlodipine 10 mg daily, clonidine 0.3 mg twice daily, hydralazine 100 mg every 8 hours, metoprolol succinate 100 mg daily - Restart metoprolol 100 mg daily, with plan to restart clonidine today as well. Will add on other medications as tolerated. Goal BP today 681 to 275 systolic - HD per nephrology - Wean supplement oxygen as tolerated  #Anemia of Chronic disease  Hgb dropped from 13.8 on admission to 7.5. Likely false on  initial labs given record shows a Hgb 7-8 at last hospitalization. Will continue to trend. No sign of acute blood loss. - Trend CBC  #ESRD - HD per nephrology  #HIV Patient reports she has been on Trimeq . - Plan to continue abacavir-dolutegravir-lamiVUDine (TRIUMEQ) 600-50-300 mg per tablet once confirmed by med rec with pharmacy.   DVT prophx: Heparin Diet: Renal Code: Full  Dispo: Anticipated discharge in 1-2 days.     Tamsen Snider, MD Internal Medicine PGY-2  Pager: [phone] After 5pm on weekdays and 1pm on weekends: On Call pager [phone]

## 2020-03-07 NOTE — Progress Notes (Signed)
[facility] Progress Note  Subjective: had HD yest , 2.5 L off, still SOB today, can't lie flat  Vitals:   03/07/20 0800 03/07/20 0900 03/07/20 1000 03/07/20 1101  BP: (!) 172/100 (!) 169/94    Pulse:    100  Resp: (!) 37 (!) 29  (!) 32  Temp:   98.5 F (36.9 C) 98.9 F (37.2 C)  TempSrc:    Oral  SpO2:      Weight:      Height:        Exam: Gen slim adult AAF, on [place] O2, mild ^wob +JVD Chest bilat rales at bases RRR no MRG Abd soft ntnd no mass or ascites +bs GU defer MS no joint effusions or deformity Ext 2+ pitting pretib edema from feet to knees Neuro is alert, Ox 3 , nf  LUA aVG+bruit   Home meds:  - no meds noted here   - per Care Everywhere was on 4-5 bp meds in July     CXR - IMPRESSION: Cardiomegaly and symmetric airspace disease/alveolar edema.     OP HD: MWF US [facility] [place] [phone] no answer today  Assessment/ Plan: 1. Pulm edema / resp distress - HD yest , off bipap but still SOB and vol overloaded w/ LE edema. HD again today, max UF. Doesn't know her dry wt.  2. Hyperkalemia - low K bath , f/u in am 3. ESRD - on HD MWF.  Missed Wed HD. HD again today off sched upstairs.    4. HIV 5. HTN - need accurate home med list, consulting pharmacy     Rob Jonnie Finner 03/07/2020, 11:08 AM   Recent Labs  Lab 03/06/20 1023 03/06/20 1827 03/07/20 0726 03/07/20 1000  K  --  4.0 5.1  --   BUN  --  22 33*  --   CREATININE  --  4.47* 5.92*  --   CALCIUM  --  9.4 9.2  --   PHOS  --  4.4  --   --   HGB   < >  --  7.6* 7.7*   < > = values in this interval not displayed.   Inpatient medications: . (feeding supplement) PROSource Plus  30 mL Oral TID BM  . Chlorhexidine Gluconate Cloth  6 each Topical Q0600  . feeding supplement  1 Container Oral BID BM  . heparin  5,000 Units Subcutaneous Q8H  . mouth rinse  15 mL Mouth Rinse BID  . megestrol  40 mg Oral BID  . metoprolol succinate  100 mg Oral Daily  .  multivitamin  1 tablet Oral QHS  . polyethylene glycol  17 g Oral Daily  . ramelteon  8 mg Oral QHS    acetaminophen **OR** acetaminophen, heparin, ondansetron **OR** ondansetron (ZOFRAN) IV, senna-docusate

## 2020-03-07 NOTE — Progress Notes (Signed)
   03/07/20 1938  Assess: MEWS Score  Temp 98 F (36.7 C)  BP (!) 182/94  Pulse Rate 92  ECG Heart Rate 92  Resp (!) 34  Level of Consciousness Alert  Assess: MEWS Score  MEWS Temp 0  MEWS Systolic 0  MEWS Pulse 0  MEWS RR 2  MEWS LOC 0  MEWS Score 2  MEWS Score Color Yellow  Assess: if the MEWS score is Yellow or Red  Were vital signs taken at a resting state? Yes  Focused Assessment No change from prior assessment  Early Detection of Sepsis Score *See Row Information* Low  MEWS guidelines implemented *See Row Information* Yes  Treat  MEWS Interventions Escalated (See documentation below)  Take Vital Signs  Increase Vital Sign Frequency  Yellow: Q 2hr X 2 then Q 4hr X 2, if remains yellow, continue Q 4hrs  Escalate  MEWS: Escalate Yellow: discuss with charge nurse/RN and consider discussing with provider and RRT  Notify: Charge Nurse/RN  Name of Charge Nurse/RN Notified Fred  Date Charge Nurse/RN Notified 03/07/20  Time Charge Nurse/RN Notified 1944  Notify: Provider  Provider Name/Title Resident MD  Date Provider Notified 03/07/20  Time Provider Notified 1940  Notification Type Page  Notification Reason Change in status  Response No new orders ("might restart home meds")  Date of Provider Response 03/07/20  Time of Provider Response 1943  Document  Patient Outcome Other (Comment)  Progress note created (see row info) Yes

## 2020-03-07 NOTE — Progress Notes (Signed)
Patient c/o abdominal pain, pt. Refuse tylenol MD paged x2 awaiting new order. Will continue to monitor the patient.

## 2020-03-08 DIAGNOSIS — I132 Hypertensive heart and chronic kidney disease with heart failure and with stage 5 chronic kidney disease, or end stage renal disease: Principal | ICD-10-CM

## 2020-03-08 DIAGNOSIS — I5042 Chronic combined systolic (congestive) and diastolic (congestive) heart failure: Secondary | ICD-10-CM

## 2020-03-08 LAB — CBC
HCT: 23.6 % — ABNORMAL LOW (ref 36.0–46.0)
Hemoglobin: 7.4 g/dL — ABNORMAL LOW (ref 12.0–15.0)
MCH: 31.4 pg (ref 26.0–34.0)
MCHC: 31.4 g/dL (ref 30.0–36.0)
MCV: 100 fL (ref 80.0–100.0)
Platelets: 251 10*3/uL (ref 150–400)
RBC: 2.36 MIL/uL — ABNORMAL LOW (ref 3.87–5.11)
RDW: 17.5 % — ABNORMAL HIGH (ref 11.5–15.5)
WBC: 6.3 10*3/uL (ref 4.0–10.5)
nRBC: 0 % (ref 0.0–0.2)

## 2020-03-08 LAB — RENAL FUNCTION PANEL
Albumin: 2.4 g/dL — ABNORMAL LOW (ref 3.5–5.0)
Anion gap: 13 (ref 5–15)
BUN: 22 mg/dL (ref 8–23)
CO2: 28 mmol/L (ref 22–32)
Calcium: 8.9 mg/dL (ref 8.9–10.3)
Chloride: 97 mmol/L — ABNORMAL LOW (ref 98–111)
Creatinine, Ser: 3.93 mg/dL — ABNORMAL HIGH (ref 0.44–1.00)
GFR, Estimated: 12 mL/min — ABNORMAL LOW (ref >60)
Glucose, Bld: 109 mg/dL — ABNORMAL HIGH (ref 70–99)
Phosphorus: 5.6 mg/dL — ABNORMAL HIGH (ref 2.5–4.6)
Potassium: 3.9 mmol/L (ref 3.5–5.1)
Sodium: 138 mmol/L (ref 135–145)

## 2020-03-08 MED ORDER — AMLODIPINE BESYLATE 10 MG PO TABS
10.0000 mg | ORAL_TABLET | Freq: Every day | ORAL | Status: DC
  Administered 2020-03-08: 10 mg via ORAL
  Filled 2020-03-08: qty 1

## 2020-03-08 MED ORDER — ABACAVIR-DOLUTEGRAVIR-LAMIVUD 600-50-300 MG PO TABS
1.0000 | ORAL_TABLET | Freq: Every day | ORAL | Status: DC
  Filled 2020-03-08: qty 1

## 2020-03-08 MED ORDER — SUMATRIPTAN SUCCINATE 25 MG PO TABS
25.0000 mg | ORAL_TABLET | Freq: Once | ORAL | Status: AC
  Administered 2020-03-08: 25 mg via ORAL
  Filled 2020-03-08: qty 1

## 2020-03-08 NOTE — Discharge Summary (Signed)
Name: Shirley Dodson MRN: [id number] DOB: 1955/07/25 64 y.o. PCP: Patient, No Pcp Per  Date of Admission: 03/06/2020  9:31 AM Date of Discharge: 03/08/2020 Attending Physician: No att. providers found  Discharge Diagnosis: 1.Acute Hypoxic Respiratory failure 2. Hypertension   Discharge Medications: Allergies as of 03/08/2020      Reactions   Penicillins Nausea Only, Rash, Other (See Comments)   Reaction occurred > 10 years ago. PCN allergy assessment completed 03/07/19 by Dorathy Daft, PharmD   Sulfa Antibiotics Nausea And Vomiting, Nausea Only, Rash   Severe gastro intolerance, gi distress   Nitrofuran Derivatives Other (See Comments)      Medication List    TAKE these medications   amLODipine 10 MG tablet Commonly known as: NORVASC Take 10 mg by mouth daily.   cloNIDine 0.2 MG tablet Commonly known as: CATAPRES Take 0.2 mg by mouth 2 (two) times daily.   hydrALAZINE 100 MG tablet Commonly known as: APRESOLINE Take 100 mg by mouth 2 (two) times daily.   megestrol 40 MG tablet Commonly known as: MEGACE Take 40 mg by mouth 2 (two) times daily.   metoprolol succinate 100 MG 24 hr tablet Commonly known as: TOPROL-XL Take 100 mg by mouth daily.       Disposition and follow-up:   Ms.Shirley Dodson was discharged from [facility] in Stable condition.  At the hospital follow up visit please address:  1.  Patient presented in acute hypoxic respiratory failure secondary to flash pulmonary edema after missing one session of HD. She is scheduled to resume normal schedule at discharge.   Also presenting with HTN emergency, she is on multiple medications for her BP. Please review her BP meds and make adjustments as needed. Recommend patient keeping a list of her medications as it seems she gets care in many different systems.    2.  Labs / imaging needed at time of follow-up: na  3.  Pending labs/ test needing follow-up: na  Follow-up  Appointments:   Hospital Course by problem list: 1. #Acute hypoxic respiratory failure secondary to flash pulmonary edema from hypertensive emergency (resolved) Presented with BP 215/110 after missing HD and acute onset SOB. Sating in the 80's and placed on Bipap. K was 6.3.  She is now status post 2 HD sessions since admission with approximately 6 L UF. Symptoms improved and K normal. Resumed home blood pressure medications. At discharge she is sating well on room air and Dyialisis coordinator has patient set up to resume her regular schedule.   #Anemia of Chronic disease  Hgb dropped from 13.8 on admission to 7.5. Likely false on initial labs given record shows a Hgb 7-8 at last hospitalization. Continued to trend and stable this morning at 7.4. No sign of acute blood loss.  #ESRD - On MWF schedule. Missed one session last Wednesday. Now s/p 2 HD sessions 8th and 9th.   #HIV Patient reports she has been on Triumeq and will resume medication at discharge  Discharge Vitals:   BP (!) 147/78 (BP Location: Right Arm)   Pulse 83   Temp 98.2 F (36.8 C) (Oral)   Resp 18   Ht 5' 6.5" (1.689 m)   Wt 49.4 kg   SpO2 100%   BMI 17.31 kg/m   Pertinent Labs, Studies, and Procedures:  CBC Latest Ref Rng & Units 03/08/2020 03/07/2020 03/07/2020  WBC 4.0 - 10.5 K/uL 6.3 - 7.5  Hemoglobin 12.0 - 15.0 g/dL 7.4(L) 7.7(L) 7.6(L)  Hematocrit 36 -  46 % 23.6(L) 25.2(L) 24.4(L)  Platelets 150 - 400 K/uL 251 - 252   CMP Latest Ref Rng & Units 03/08/2020 03/07/2020 03/06/2020  Glucose 70 - 99 mg/dL 109(H) 103(H) 85  BUN 8 - 23 mg/dL 22 33(H) 22  Creatinine 0.44 - 1.00 mg/dL 3.93(H) 5.92(H) 4.47(H)  Sodium 135 - 145 mmol/L 138 139 139  Potassium 3.5 - 5.1 mmol/L 3.9 5.1 4.0  Chloride 98 - 111 mmol/L 97(L) 98 97(L)  CO2 22 - 32 mmol/L 28 28 24   Calcium 8.9 - 10.3 mg/dL 8.9 9.2 9.4  Total Protein 6.5 - 8.1 g/dL - 6.3(L) -  Total Bilirubin 0.3 - 1.2 mg/dL - 1.0 -  Alkaline Phos 38 - 126 U/L - 105 -   AST 15 - 41 U/L - 50(H) -  ALT 0 - 44 U/L - 29 -   DG Chest Port 1 View  Result Date: 03/06/2020 CLINICAL DATA:  Shortness of breath EXAM: PORTABLE CHEST 1 VIEW COMPARISON:  None. FINDINGS: Symmetric hazy airspace opacity. Cardiomegaly and small pleural effusions. No pneumothorax. Vascular stent in the left arm, likely related to dialysis access. IMPRESSION: Cardiomegaly and symmetric airspace disease/alveolar edema. Electronically Signed   By: Monte Fantasia M.D.   On: 03/06/2020 10:45    Discharge Instructions: Discharge Instructions    Diet - low sodium heart healthy   Complete by: As directed    Increase activity slowly   Complete by: As directed       Signed: Madalyn Rob, MD 03/10/2020, 9:51 AM

## 2020-03-08 NOTE — Progress Notes (Addendum)
Internal Medicine Progress Note  Subjective:  I received a page stating that patient complained of 10/10 headache and abdominal cramping. I examined patient at the bedside. She stated her headache was a 9/10, located diffusely about her head (bilateral), was throbbing in nature, and started suddenly around 2:00am, remaining constant since onset. She had just received HD this evening. She says that her headache feels like a migraine to her, and she states she has experienced similar headaches after dialysis in the past. She denies any associated symptoms, including no nausea, vomiting, photophobia, phonophobia, light-headedness, dizziness, acute weakness, palpitations, CP, SOB, numbness or tingling. She does say she is generally weak, although unchanged since admission. She notes that she has some abdominal cramping, stating she had surgery 2 weeks ago where they took out her catheter, and feels her pain may be due to so many pills on an empty stomach. She says she feels hungry, requesting food despite decreased appetite. She does note some diarrhea. She had been refusing tylenol earlier.  Objective:  Patient is on continuous vitals monitoring. BP stable 671'I-458'K systolic.  HR stable 80's-90's. SpO2 100% on 3L [place]  RR ~ 22 breaths per minute Temperature 98.5  General: Patient is resting comfortably in no acute distress  Cardiac: Normal rate and rhythm. No murmurs, rubs, or gallops.  Respiratory: Mild tachypnea, on 3L [place]. Upper lung fields clear to auscultation bilaterally. Abdominal: There are steristrips over central abdomen and pad overlying previous peritoneal dialysis catheter placement site without active drainage, erythema, or TTP. Abdomen is slightly rigid although there is no abdominal TTP, guarding, or rebound.  Neurological: Cranial nerves II-XII intact. Sensation grossly intact. Musculoskeletal: There is diffuse cachexia although strength is intact in all four extremities.  Skin: See  abdominal comments. No other lesions or rashes noted.  Assessment/Plan:  1. Headache Patient's headache is likely tension-type headache given bilateral in nature and may be due to recent hemodialysis given history of the same with previous sessions. Also consider the possibility this may be due hypertension, although home medications including hydralazine and clonidine were started this evening and blood pressures are chronically elevated to the 998'P-382'N systolic. May also be related to anemia given large drop in hemoglobin since admission, although patient does have history of anemia of chronic disease with similarly low hemoglobin in the past and is s/p HD session today. Vital signs stable and neurological exam unremarkable.  - Patient agreeable to try tylenol and see if symptoms improve - Consider alternative causes due to HIV status, although would not expect symptoms to be this acute  2. Asymptomatic Normocytic Anemia Hemoglobin of 13.8 yesterday dropped to 7.6 this morning. MCV this morning 101.2. Most recent CBC 0247 showed hemoglobin 7.4, MCV 100.0. Per chart review, patient has a history of similarly low hemoglobin due to anemia of chronic kidney disease. Patient has no symptoms with normal vital signs aside from mild tachypnea.  - Continue to monitor CBC  - No signs of active bleeding currently  3. Abdominal Cramping Patient endorses mild abdominal cramping which she believes to be related to "prior abdominal surgery" 2 weeks ago and taking so many pills this admission on an empty stomach. She does have steristrips on her central abdomen with a bandage covering her LUQ region. On chart review, patient had laparoscopic peritoneal dialysis catheter placed 8/9, with plan to reposition catheter if discomfort continued; however, patient placed on HD here. On exam, she had no acute abdominal TTP, guarding, or rebound with no erythema or drainage  from surgical sites. - Reassess in am or sooner  if worsening pain - Will try eating cereal at patient's request and assess tolerance to this  Jeralyn Bennett, MD 03/08/2020, 3:50 AM Pager: [phone]

## 2020-03-08 NOTE — Progress Notes (Signed)
[facility] Progress Note  Subjective: HD yest again, 3.5 L off net, on RA this am. No c/o  Vitals:   03/08/20 1000 03/08/20 1100 03/08/20 1124 03/08/20 1158  BP: (!) 144/74 (!) 153/78  (!) 147/78  Pulse: 81 78  83  Resp: 18 17  18   Temp:    98.2 F (36.8 C)  TempSrc:    Oral  SpO2: 93% 96%  100%  Weight:   49.4 kg   Height:        Exam: Gen frail and slim adult AAF, on room air Chest mostly clear RRR no MRG Abd soft ntnd no mass or ascites +bs Ext 1+ ankle edema bilat, much improved Neuro is alert, Ox 3 , nf  LUA aVG+bruit   Home meds:  - no meds noted here   - per Care Everywhere was on 4-5 bp meds in July     CXR - IMPRESSION: Cardiomegaly and symmetric airspace disease/alveolar edema.     OP HD: MWF [facility] [address], [place]  - [phone] no answer when I called Sat  Assessment/ Plan: 1. Pulm edema / resp distress - resolved w/ HD x 2 here w/ 6 L off total. Is off O2 today and ok for dc from renal standpoint. We will notify OP unit tomorrow w/ her discharge wt of 49.5kg.  2. Hyperkalemia - resolved 3. ESRD - on HD MWF.  Missed Wed HD. Had HD x 2 here.  4. HIV 5. HTN - on BP meds at home     [place] 03/08/2020, 12:54 PM   Recent Labs  Lab 03/06/20 1827 03/06/20 1827 03/07/20 0726 03/07/20 0726 03/07/20 1000 03/08/20 0247  K 4.0   < > 5.1  --   --  3.9  BUN 22   < > 33*  --   --  22  CREATININE 4.47*   < > 5.92*  --   --  3.93*  CALCIUM 9.4   < > 9.2  --   --  8.9  PHOS 4.4  --   --   --   --  5.6*  HGB  --   --  7.6*   < > 7.7* 7.4*   < > = values in this interval not displayed.   Inpatient medications: . (feeding supplement) PROSource Plus  30 mL Oral TID BM  . abacavir-dolutegravir-lamiVUDine  1 tablet Oral Daily  . amLODipine  10 mg Oral Daily  . Chlorhexidine Gluconate Cloth  6 each Topical Q0600  . cloNIDine  0.2 mg Oral BID  . feeding supplement  1 Container Oral BID BM  . heparin  5,000 Units  Subcutaneous Q8H  . hydrALAZINE  100 mg Oral BID  . mouth rinse  15 mL Mouth Rinse BID  . megestrol  40 mg Oral BID  . metoprolol succinate  100 mg Oral Daily  . multivitamin  1 tablet Oral QHS  . polyethylene glycol  17 g Oral Daily  . ramelteon  8 mg Oral QHS    acetaminophen **OR** acetaminophen, ondansetron **OR** ondansetron (ZOFRAN) IV, senna-docusate

## 2020-03-08 NOTE — Progress Notes (Signed)
Patient refused to place tele back on and said that she is going home.

## 2020-03-08 NOTE — Progress Notes (Addendum)
Subjective:  Shirley [facility]son is a 64 y.o. with PMH of ESRD on HD, HIV, HTN, HLD , chronic systolic and diastolic heart failure with reported EF of 45%, LVH, pulmonary HTN, sigmoid diverticulosis, and anemia of chronic disease admitted for acute hypoxic respiratory failure secondary to flash pulmonary edema from hypertensive urgency on hospital day 1  Overnight the call team was paged to evaluate headache and abdominal cramping. Headache improved with sumatriptan and abdominal pain thought to be do to a empty stomach.   This morning patient says she is feeling much better than when she was admitted. She feels tired, but the HD takes a lot of energy out of her. Her breathing has improved.  Denies any acute concerns.  Objective:  Vital signs in last 24 hours: Vitals:   03/07/20 2325 03/08/20 0020 03/08/20 0355 03/08/20 0440  BP: (!) 147/81 (!) 161/77 (!) 155/85 (!) 167/84  Pulse: 87 84 84 78  Resp: (!) 33 19 (!) 23 18  Temp: 98.8 F (37.1 C) 98.5 F (36.9 C) 98.4 F (36.9 C) 98.7 F (37.1 C)  TempSrc: Oral Oral Oral Oral  SpO2: 100% 100% 100% 100%  Weight:      Height:        Gen: slender AAF in NAD wearing nasal canula Pulm: No rales, no increase work of breathing. Sating well on 2 L  Card: regular rate, regular rhythm, no murmurs rubs or gallops Ext: Trace edema, warm distal ext Neuro: A&Ox4    CBC Latest Ref Rng & Units 03/08/2020 03/07/2020 03/07/2020  WBC 4.0 - 10.5 K/uL 6.3 - 7.5  Hemoglobin 12.0 - 15.0 g/dL 7.4(L) 7.7(L) 7.6(L)  Hematocrit 36 - 46 % 23.6(L) 25.2(L) 24.4(L)  Platelets 150 - 400 K/uL 251 - 252    BMP Latest Ref Rng & Units 03/08/2020 03/07/2020 03/06/2020  Glucose 70 - 99 mg/dL 109(H) 103(H) 85  BUN 8 - 23 mg/dL 22 33(H) 22  Creatinine 0.44 - 1.00 mg/dL 3.93(H) 5.92(H) 4.47(H)  Sodium 135 - 145 mmol/L 138 139 139  Potassium 3.5 - 5.1 mmol/L 3.9 5.1 4.0  Chloride 98 - 111 mmol/L 97(L) 98 97(L)  CO2 22 - 32 mmol/L 28 28 24   Calcium 8.9 - 10.3 mg/dL 8.9  9.2 9.4     Assessment/Plan:  Active Problems:   Acute respiratory failure with hypoxia (HCC)   HIV disease (HCC)   HTN (hypertension)   Anemia of chronic disease   Chronic combined systolic and diastolic congestive heart failure (HCC)  Shirley Dodson is a 64 y.o. with PMH of ESRD on HD, HIV, HTN, HLD , chronic systolic and diastolic heart failure with reported EF of 45%, LVH, pulmonary HTN, sigmoid diverticulosis, and anemia of chronic disease admitted for acute hypoxic respiratory failure secondary to flash pulmonary edema from hypertensive urgency on hospital day 1.   #Acute hypoxic respiratory failure secondary to flash pulmonary edema from hypertensive emergency (resolved) Patient appears comfortable on exam this morning.  She is now status post 2 HD sessions since admission with approximately 6 L UF.  Pharmacy has transitioned med rec and we restarted all home blood pressure medications.  Hypertensive on exam, but receiving morning medications now. Turned supplemental oxygen completely off on exam and patient sats were in the low 90s. She may required another session of HD before discharge.  -Started hydralazine 100 mg twice daily, metoprolol succinate 100 mg daily, clonidine 0.2 mg twice daily, amlodipine 10 mg daily -We will continue to titrate blood pressure medications.  Hydralazine could be scheduled for another dose daily, clonidine dose could be increased to 0.3 mg, and could consider adding losartan if needed at home  - HD per nephrology - Wean supplement oxygen as tolerated  #Anemia of Chronic disease  Hgb dropped from 13.8 on admission to 7.5. Likely false on initial labs given record shows a Hgb 7-8 at last hospitalization. Will continue to trend and stable this morning at 7.4. No sign of acute blood loss. - Trend CBC  #ESRD - On MWF schedule. Missed one session last Wednesday. Now s/p 2 HD sessions 8th and 9th.  - HD per nephrology  #HIV Patient reports she has been  on Triumeq - Plan to continue abacavir-dolutegravir-lamiVUDine (TRIUMEQ) 600-50-300 mg per tablet   DVT prophx: Heparin Diet: Renal Code: Full  Dispo: Anticipated discharge in 1-2 days.      Tamsen Snider, MD Internal Medicine PGY-2  Pager: [phone] After 5pm on weekdays and 1pm on weekends: On Call pager [phone]

## 2020-03-08 NOTE — Discharge Instructions (Addendum)
Shirley Dodson,  I am glad you are feeling better. You had difficulty breathing because your blood pressure and missing hemodialysis caused fluid to be on your lungs. You have improved with restarting your blood pressure medications and 2 sessions of hemodialysis. You should go to hemodialysis at your regular facility tomorrow and follow up with your new primary care provider to further adjust blood pressure medications.  Have a safe trip back to [place]. I am glad your daughter is coming to drive you back.

## 2020-03-09 ENCOUNTER — Telehealth: Payer: Self-pay | Admitting: Infectious Disease

## 2020-03-09 LAB — HIV-1/2 AB - DIFFERENTIATION
HIV 1 Ab: POSITIVE — AB
HIV 2 Ab: UNDETERMINED

## 2020-03-09 LAB — POCT I-STAT, CHEM 8
BUN: 91 mg/dL — ABNORMAL HIGH (ref 8–23)
Calcium, Ion: 1 mmol/L — ABNORMAL LOW (ref 1.15–1.40)
Chloride: 108 mmol/L (ref 98–111)
Creatinine, Ser: 10.9 mg/dL — ABNORMAL HIGH (ref 0.44–1.00)
Glucose, Bld: 103 mg/dL — ABNORMAL HIGH (ref 70–99)
HCT: 44 % (ref 36.0–46.0)
Hemoglobin: 15 g/dL (ref 12.0–15.0)
Potassium: 6.3 mmol/L (ref 3.5–5.1)
Sodium: 139 mmol/L (ref 135–145)
TCO2: 22 mmol/L (ref 22–32)

## 2020-03-09 NOTE — Telephone Encounter (Signed)
Patient had HIV VL at [facility] in April of 2021 but apparently lives near [place]. Not certain who is caring for her right now.

## 2020-03-13 NOTE — Telephone Encounter (Signed)
Left message asking Shirley Dodson to call back if she is in the [place] area now and needs help connecting to care for chronic needs. Marland Kitchenmec

## 2021-11-03 IMAGING — DX DG CHEST 1V PORT
1 series · 1 of 1 positions shown · non-contrast
Comparison: None.

CLINICAL DATA: Shortness of breath

EXAM:
PORTABLE CHEST 1 VIEW

[chest ap]
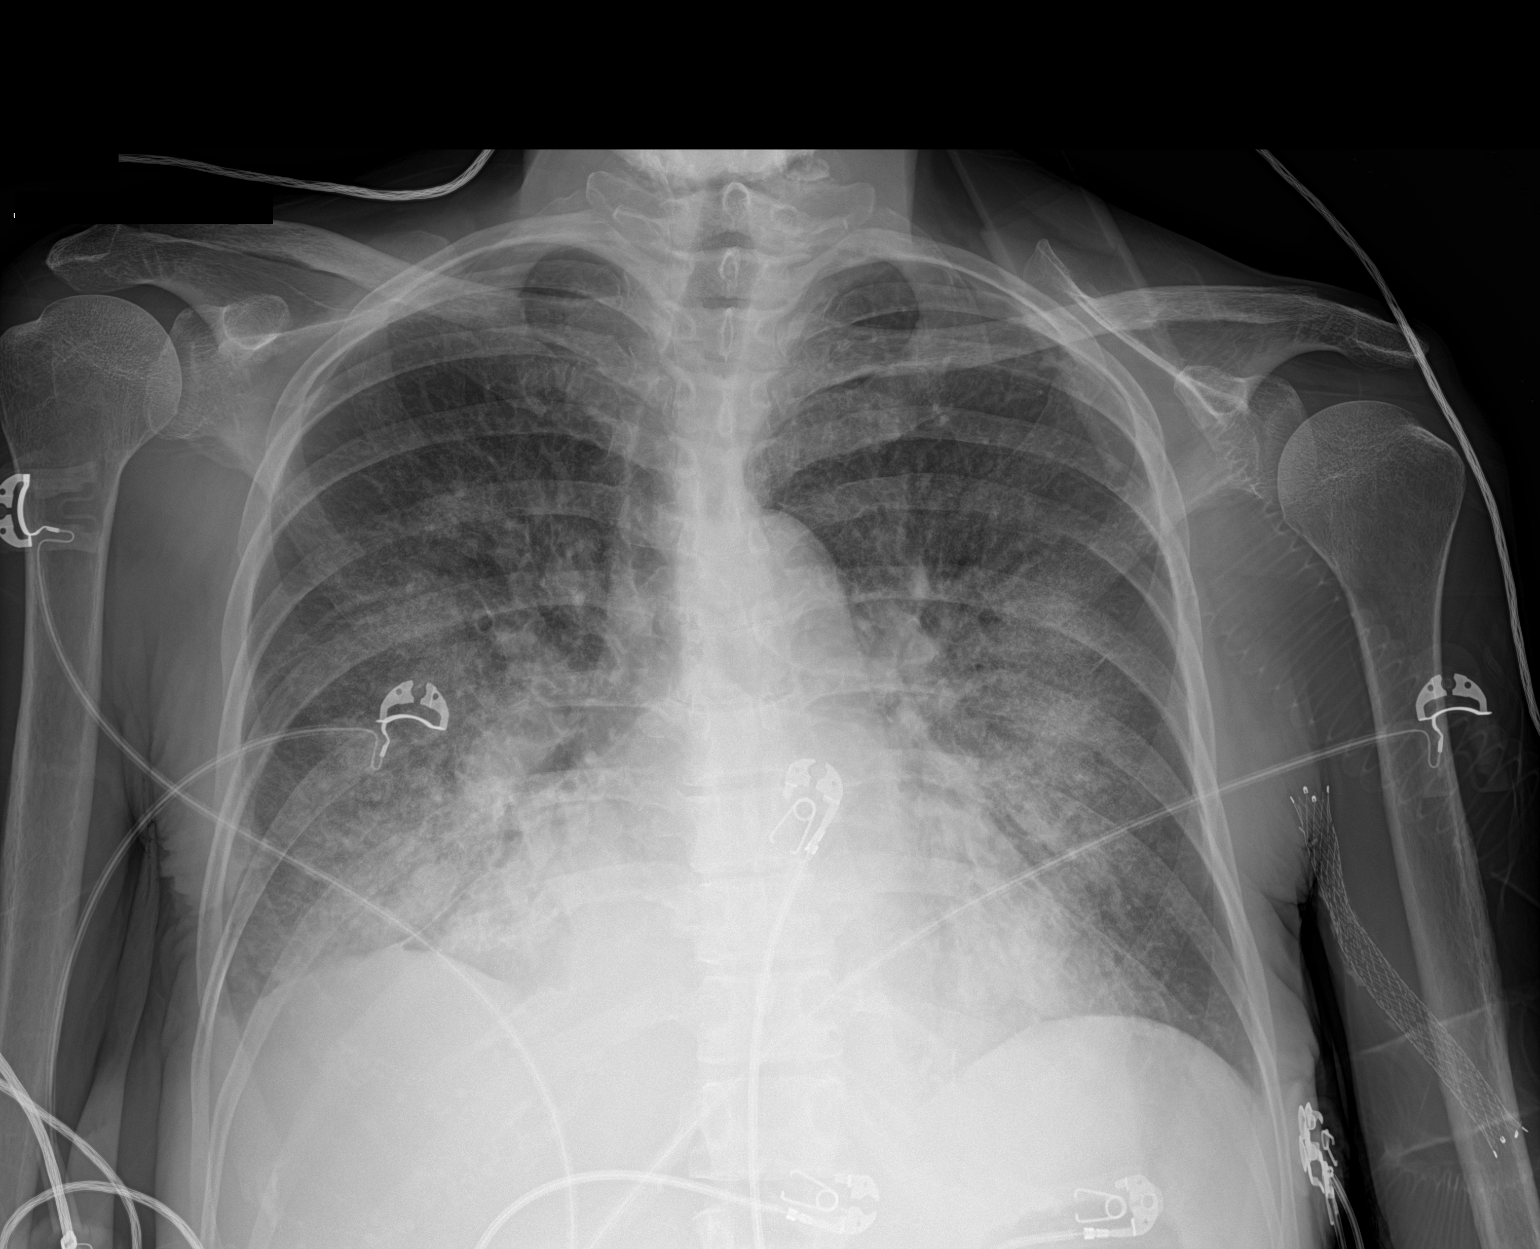

[1 of 1 positions shown; findings below may reference images not displayed]

FINDINGS: Symmetric hazy airspace opacity. Cardiomegaly and small pleural
effusions. No pneumothorax.

Vascular stent in the left arm, likely related to dialysis access.
IMPRESSION: Cardiomegaly and symmetric airspace disease/alveolar edema.
